# Patient Record
Sex: Male | Born: 1990
Health system: Southern US, Community
[De-identification: ages and names within clinical notes are randomized; demographics above are authoritative.]

---
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
## 2016-04-27 DIAGNOSIS — R05 Cough: Secondary | ICD-10-CM | POA: Diagnosis not present

## 2016-04-27 DIAGNOSIS — J029 Acute pharyngitis, unspecified: Secondary | ICD-10-CM | POA: Diagnosis not present

## 2016-07-31 DIAGNOSIS — J3489 Other specified disorders of nose and nasal sinuses: Secondary | ICD-10-CM | POA: Diagnosis not present

## 2016-07-31 DIAGNOSIS — R599 Enlarged lymph nodes, unspecified: Secondary | ICD-10-CM | POA: Diagnosis not present

## 2016-09-13 DIAGNOSIS — L600 Ingrowing nail: Secondary | ICD-10-CM | POA: Diagnosis not present

## 2016-09-15 DIAGNOSIS — L600 Ingrowing nail: Secondary | ICD-10-CM | POA: Diagnosis not present

## 2016-11-27 DIAGNOSIS — R112 Nausea with vomiting, unspecified: Secondary | ICD-10-CM | POA: Diagnosis not present

## 2016-11-27 DIAGNOSIS — R5383 Other fatigue: Secondary | ICD-10-CM | POA: Diagnosis not present

## 2017-06-21 DIAGNOSIS — W109XXA Fall (on) (from) unspecified stairs and steps, initial encounter: Secondary | ICD-10-CM | POA: Diagnosis not present

## 2017-06-21 DIAGNOSIS — M546 Pain in thoracic spine: Secondary | ICD-10-CM | POA: Diagnosis not present

## 2017-07-28 DIAGNOSIS — S335XXA Sprain of ligaments of lumbar spine, initial encounter: Secondary | ICD-10-CM | POA: Diagnosis not present

## 2017-07-31 DIAGNOSIS — M62830 Muscle spasm of back: Secondary | ICD-10-CM | POA: Diagnosis not present

## 2017-12-27 DIAGNOSIS — F411 Generalized anxiety disorder: Secondary | ICD-10-CM | POA: Insufficient documentation

## 2017-12-27 DIAGNOSIS — F332 Major depressive disorder, recurrent severe without psychotic features: Secondary | ICD-10-CM | POA: Insufficient documentation

## 2017-12-27 DIAGNOSIS — G8929 Other chronic pain: Secondary | ICD-10-CM | POA: Insufficient documentation

## 2018-08-13 ENCOUNTER — Ambulatory Visit (INDEPENDENT_AMBULATORY_CARE_PROVIDER_SITE_OTHER): Payer: 59 | Admitting: Licensed Clinical Social Worker

## 2018-08-13 DIAGNOSIS — F411 Generalized anxiety disorder: Secondary | ICD-10-CM | POA: Diagnosis not present

## 2018-08-15 ENCOUNTER — Encounter (HOSPITAL_COMMUNITY): Payer: Self-pay | Admitting: Licensed Clinical Social Worker

## 2018-08-15 NOTE — Progress Notes (Signed)
Virtual Visit via Video Note  I connected with Ronald Callahan on 08/14/18 at  2:30 PM EDT by a video enabled telemedicine application and verified that I am speaking with the correct person using two identifiers.  Location: Patient: Home Provider: Office   I discussed the limitations of evaluation and management by telemedicine and the availability of in person appointments. The patient expressed understanding and agreed to proceed.     I discussed the assessment and treatment plan with the patient. The patient was provided an opportunity to ask questions and all were answered. The patient agreed with the plan and demonstrated an understanding of the instructions.   The patient was advised to call back or seek an in-person evaluation if the symptoms worsen or if the condition fails to improve as anticipated.  I provided 55 minutes of non-face-to-face time during this encounter.   Archie Balboa, LCAS-A    Comprehensive Clinical Assessment (CCA) Note  08/15/2018 Ronald Callahan [id number]  Visit Diagnosis:      ICD-10-CM   1. Generalized anxiety disorder  F41.1       CCA Part One  Part One has been completed on paper by the patient.  (See scanned document in Chart Review)  CCA Part Two A  Intake/Chief Complaint:  CCA Intake With Chief Complaint CCA Part Two Date: 08/13/18 CCA Part Two Time: 35 Chief Complaint/Presenting Problem: Referred by PHP for help w/ anxiety and depression Patients Currently Reported Symptoms/Problems: excessive worry, restlessness, tension, isolating, catastrophizing Individual's Strengths: "I like to complete things" Individual's Abilities: Able bodied Type of Services Patient Feels Are Needed: Not sure  Mental Health Symptoms Depression:  Depression: Hopelessness, Difficulty Concentrating, Sleep (too much or little), Weight gain/loss, Worthlessness, Irritability, Change in energy/activity  Mania:     Anxiety:   Anxiety: Difficulty  concentrating, Tension, Worrying, Restlessness, Irritability, Fatigue  Psychosis:     Trauma:     Obsessions:     Compulsions:     Inattention:     Hyperactivity/Impulsivity:     Oppositional/Defiant Behaviors:     Borderline Personality:     Other Mood/Personality Symptoms:      Mental Status Exam Appearance and self-care  Stature:  Stature: Average  Weight:  Weight: Overweight  Clothing:  Clothing: Casual  Grooming:  Grooming: Normal  Cosmetic use:  Cosmetic Use: None  Posture/gait:  Posture/Gait: Normal  Motor activity:  Motor Activity: Not Remarkable  Sensorium  Attention:  Attention: Normal, Persistent  Concentration:  Concentration: Normal  Orientation:  Orientation: X5  Recall/memory:  Recall/Memory: Normal  Affect and Mood  Affect:  Affect: Blunted  Mood:  Mood: Euthymic  Relating  Eye contact:  Eye Contact: Normal  Facial expression:  Facial Expression: Responsive  Attitude toward examiner:  Attitude Toward Examiner: Cooperative  Thought and Language  Speech flow: Speech Flow: Normal  Thought content:  Thought Content: Appropriate to mood and circumstances  Preoccupation:     Hallucinations:     Organization:     Transport planner of Knowledge:  Fund of Knowledge: Average  Intelligence:  Intelligence: Average  Abstraction:  Abstraction: Psychologist, sport and exercise:  Judgement: Common-sensical  Reality Testing:  Reality Testing: Adequate  Insight:  Insight: Flashes of insight  Decision Making:  Decision Making: Normal  Social Functioning  Social Maturity:  Social Maturity: Responsible  Social Judgement:  Social Judgement: Normal  Stress  Stressors:  Stressors: Transitions  Coping Ability:     Skill Deficits:     Supports:  Family and Psychosocial History: Family history Marital status: Long term relationship Long term relationship, how long?: 6 years What types of issues is patient dealing with in the relationship?: PT wants to be alone when he  has anxiety, Partner tries to be supportive but PT wants to be alone. Additional relationship information: engaged 1 month ago Are you sexually active?: Yes What is your sexual orientation?: heterosexual Does patient have children?: No  Childhood History:  Childhood History By whom was/is the patient raised?: Both parents Additional childhood history information: na Description of patient's relationship with caregiver when they were a child: Good w/ mother, father was always working a lot; No one in my family really showed much emotion but when they did there was yelling. Patient's description of current relationship with people who raised him/her: My parents are in the middle of a divorce, it's sad but I've seen it coming. Does patient have siblings?: Yes Number of Siblings: 3 Description of patient's current relationship with siblings: Older sister and younger brother and sister. My older sister has anxiety. I see them all for holidays and occassionally throughout the year Did patient suffer any verbal/emotional/physical/sexual abuse as a child?: No Did patient suffer from severe childhood neglect?: No Has patient ever been sexually abused/assaulted/raped as an adolescent or adult?: No Was the patient ever a victim of a crime or a disaster?: No Witnessed domestic violence?: No Has patient been effected by domestic violence as an adult?: No  CCA Part Two B  Employment/Work Situation: Employment / Work Psychologist, occupationalituation Employment situation: Employed  Education: Field seismologistducation Name of High School: [employer] Did Garment/textile technologistYou Graduate From [employer]?: Yes Did Theme park managerYou Attend College?: Yes What Type of College Degree Do you Have?: Quarry managerBachelors Exercise and Sports Science  Religion: Religion/Spirituality Are You A Religious Person?: No  Leisure/Recreation: Leisure / Recreation Leisure and Hobbies: Landishing  Exercise/Diet: Exercise/Diet Do You Exercise?: Yes How Many Times a Week Do You  Exercise?: 1-3 times a week Have You Gained or Lost A Significant Amount of Weight in the Past Six Months?: Yes-Gained Do You Follow a Special Diet?: No Do You Have Any Trouble Sleeping?: Yes Explanation of Sleeping Difficulties: PT reports he wakes up often in the night  CCA Part Two C  Alcohol/Drug Use: Alcohol / Drug Use History of alcohol / drug use?: No history of alcohol / drug abuse                      CCA Part Three  ASAM's:  Six Dimensions of Multidimensional Assessment  Dimension 1:  Acute Intoxication and/or Withdrawal Potential:     Dimension 2:  Biomedical Conditions and Complications:     Dimension 3:  Emotional, Behavioral, or Cognitive Conditions and Complications:     Dimension 4:  Readiness to Change:     Dimension 5:  Relapse, Continued use, or Continued Problem Potential:     Dimension 6:  Recovery/Living Environment:      Substance use Disorder (SUD)    Social Function:  Social Functioning Social Maturity: Responsible Social Judgement: Normal  Stress:  Stress Stressors: Transitions Patient Takes Medications The Way The Doctor Instructed?: Yes Priority Risk: Low Acuity  Risk Assessment- Self-Harm Potential: Risk Assessment For Self-Harm Potential Thoughts of Self-Harm: No current thoughts Method: No plan  Risk Assessment -Dangerous to Others Potential:    DSM5 Diagnoses: There are no active problems to display for this patient.   Patient Centered Plan: Patient is on the following Treatment  Plan(s):  Anxiety  Recommendations for Services/Supports/Treatments: Recommendations for Services/Supports/Treatments Recommendations For Services/Supports/Treatments: Individual Therapy  Treatment Plan Summary: OP Treatment Plan Summary: I have general anxiety, social anxiety, and some depression  Referrals to Alternative Service(s): Referred to Alternative Service(s):   Place:   Date:   Time:    Referred to Alternative Service(s):   Place:    Date:   Time:    Referred to Alternative Service(s):   Place:   Date:   Time:    Referred to Alternative Service(s):   Place:   Date:   Time:     Margo CommonWesley E Swan

## 2018-08-27 ENCOUNTER — Ambulatory Visit (INDEPENDENT_AMBULATORY_CARE_PROVIDER_SITE_OTHER): Payer: 59 | Admitting: Licensed Clinical Social Worker

## 2018-08-27 ENCOUNTER — Other Ambulatory Visit: Payer: Self-pay

## 2018-08-27 DIAGNOSIS — F411 Generalized anxiety disorder: Secondary | ICD-10-CM

## 2018-08-27 NOTE — Progress Notes (Signed)
Patient called 15 min prior to session to state that he is unable to attend due to Internet restriction and does not want to meet via telephone.

## 2018-09-09 ENCOUNTER — Encounter (HOSPITAL_COMMUNITY): Payer: Self-pay | Admitting: Psychiatry

## 2018-09-09 ENCOUNTER — Ambulatory Visit (INDEPENDENT_AMBULATORY_CARE_PROVIDER_SITE_OTHER): Payer: 59 | Admitting: Psychiatry

## 2018-09-09 DIAGNOSIS — F411 Generalized anxiety disorder: Secondary | ICD-10-CM

## 2018-09-09 DIAGNOSIS — F331 Major depressive disorder, recurrent, moderate: Secondary | ICD-10-CM | POA: Diagnosis not present

## 2018-09-09 MED ORDER — VENLAFAXINE HCL 37.5 MG PO TABS: 37.5000 mg | ORAL_TABLET | Freq: Two times a day (BID) | ORAL | 1 refills | Status: DC

## 2018-09-09 NOTE — Progress Notes (Signed)
e  Psychiatric Initial Adult Assessment   Patient Identification: Ronald BlancJason M Callahan MRN:  [id number] Date of Evaluation:  09/09/2018 Referral Source: primary care Chief Complaint:   Chief Complaint    Anxiety; Establish Care     Visit Diagnosis:    ICD-10-CM   1. Generalized anxiety disorder  F41.1   2. MDD (major depressive disorder), recurrent episode, moderate (HCC)  F33.1    Virtual Visit via Video Note  I connected with Ronald BlancJason M Callahan on 09/09/18 at  2:00 PM EDT by a video enabled telemedicine application and verified that I am speaking with the correct person using two identifiers.   I discussed the limitations of evaluation and management by telemedicine and the availability of in person appointments. 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 He is engaged   Patient has been experiencing anxiety and depression he has been on Lexapro currently at a dose of 10 mg it was increased before but it caused more side effects or concerns he still having depression sadness withdrawn behavior at times decreased interest decreased energy and getting emotional sometimes with crying spells.  He feels Lexapro does help the depression and anxiety but some concern is a decreased libido and sexual side effects  Has been on BuSpar and Vistaril for anxiety that did not help he has been on Xanax sporadically and a small dose in the past that has helped with acute anxiety and panic-like symptoms  He endorses worries, excessive worries about his future about his marriage coming in about finances and that leads to further depression and sometimes mood agitation  He endorses episodes of depression in the last week or 2 including sadness withdrawn decreased energy decreased interest in things but no hopelessness or suicidal thoughts  Denies psychotic symptoms denies manic symptoms  currently or in the past  He is currently doing therapy missed last appointment  He likes to do fishing at times to distract himself but as of now his has been less as his interest and energy level is low  He is more worried about his anxiety rather than his depression he feels depression comes in with anxiety and excessive worries  Family history of anxiety  Amongst  his sister  No past trauma significant for bad memories from the past   Modifying factor: Fiance, job Aggravating factor: finances, wedding planning, planning for school  Duration : few years Severity of anxiety: 7/10 . 10 being extreme Timing : variable Drug use: denies drinking regularly Past pscyh admission: denies   Past Psychiatric History:anxiety  Previous Psychotropic Medications: Yes   Substance Abuse History in the last 12 months:  No.  Consequences of Substance Abuse: NA  Past Medical History: No past medical history on file. No past surgical history on file.  Family Psychiatric History: sister: anxiety  Family History: No family history on file.  Social History:   Social History   Socioeconomic History  . Marital status: Single    Spouse name: Not on file  . Number of children: Not on file  . Years of education: Not on file  . Highest education level: Not on file  Occupational History  . Not on file  Social Needs  . Financial resource strain: Not on file  . Food insecurity    Worry: Not on file    Inability: Not on file  . Transportation needs  Medical: Not on file    Non-medical: Not on file  Tobacco Use  . Smoking status: Not on file  Substance and Sexual Activity  . Alcohol use: Not on file  . Drug use: Not on file  . Sexual activity: Not on file  Lifestyle  . Physical activity    Days per week: Not on file    Minutes per session: Not on file  . Stress: Not on file  Relationships  . Social Musicianconnections    Talks on phone: Not on file    Gets together: Not on file     Attends religious service: Not on file    Active member of club or organization: Not on file    Attends meetings of clubs or organizations: Not on file    Relationship status: Not on file  Other Topics Concern  . Not on file  Social History Narrative  . Not on file    Additional Social History: grew up with parents. Growing was good. No trauma   Allergies:   Allergies  Allergen Reactions  . Amoxicillin Hives    Metabolic Disorder Labs: No results found for: HGBA1C, MPG No results found for: PROLACTIN No results found for: CHOL, TRIG, HDL, CHOLHDL, VLDL, LDLCALC No results found for: TSH  Therapeutic Level Labs: No results found for: LITHIUM No results found for: CBMZ No results found for: VALPROATE  Current Medications: Current Outpatient Medications  Medication Sig Dispense Refill  . venlafaxine (EFFEXOR) 37.5 MG tablet Take 1 tablet (37.5 mg total) by mouth 2 (two) times daily with a meal. 60 tablet 1   No current facility-administered medications for this visit.     Musculoskeletal: Strength & Muscle Tone: within normal limits Gait & Station: normal Patient leans: N/A  Psychiatric Specialty Exam: Review of Systems  Cardiovascular: Negative for chest pain.  Skin: Negative for rash.  Psychiatric/Behavioral: The patient is nervous/anxious.     There were no vitals taken for this visit.There is no height or weight on file to calculate BMI.  General Appearance: Casual  Eye Contact:  Fair  Speech:  Normal Rate  Volume:  Decreased  Mood:  subdued  Affect:  Congruent  Thought Process:  Goal Directed  Orientation:  Full (Time, Place, and Person)  Thought Content:  Rumination  Suicidal Thoughts:  No  Homicidal Thoughts:  No  Memory:  Immediate;   Fair Recent;   Fair  Judgement:  Fair  Insight:  Fair  Psychomotor Activity:  Normal  Concentration:  Concentration: Fair and Attention Span: Fair  Recall:  FiservFair  Fund of Knowledge:Fair  Language: Fair   Akathisia:  No  Handed:  Right  AIMS (if indicated):  not done  Assets:  Desire for Improvement Physical Health  ADL's:  Intact  Cognition: WNL  Sleep:  Fair   Screenings:   Assessment and Plan: as follows MDD moderate: stop lexapro in next 3 days with taper . Start effexor and increase to bid to 75mg  daily dose  Re eval side effects and call earlier if concerns Compliance with therapy to work on coping skills and anxiety Other choices if need and concern remain of side effects would be wellbutrin  GAD: start effexor as above, reviewed side effects  other choices if need to change would be to use cymbalta  I discussed the assessment and treatment plan with the patient. The patient was provided an opportunity to ask questions and all were answered. The patient agreed with the plan and demonstrated  an understanding of the instructions.   The patient was advised to call back or seek an in-person evaluation if the symptoms worsen or if the condition fails to improve as anticipated.  Fu 3 w or earlier if needed for any concerns Merian Capron, MD 8/24/20202:34 PM

## 2018-09-16 ENCOUNTER — Encounter (HOSPITAL_COMMUNITY): Payer: Self-pay | Admitting: Licensed Clinical Social Worker

## 2018-09-16 ENCOUNTER — Ambulatory Visit (INDEPENDENT_AMBULATORY_CARE_PROVIDER_SITE_OTHER): Payer: 59 | Admitting: Licensed Clinical Social Worker

## 2018-09-16 DIAGNOSIS — F411 Generalized anxiety disorder: Secondary | ICD-10-CM

## 2018-09-16 NOTE — Progress Notes (Signed)
Virtual Visit via Video Note  I connected with Ronald Callahan on 09/16/18 at  3:30 PM EDT by a video enabled telemedicine application and verified that I am speaking with the correct person using two identifiers.  Location: Patient: Home Provider: Office   I discussed the limitations of evaluation and management by telemedicine and the availability of in person appointments. The patient expressed understanding and agreed to proceed.    I discussed the assessment and treatment plan with the patient. The patient was provided an opportunity to ask questions and all were answered. The patient agreed with the plan and demonstrated an understanding of the instructions.   The patient was advised to call back or seek an in-person evaluation if the symptoms worsen or if the condition fails to improve as anticipated.  I provided 50 minutes of non-face-to-face time during this encounter.   Archie Balboa, LCAS-A    THERAPIST PROGRESS NOTE  Session Time: 3:30-4:30PM  Participation Level: Active  Behavioral Response: Well GroomedAlertEuthymic  Type of Therapy: Individual Therapy  Treatment Goals addressed: Anxiety  Interventions: CBT  Summary: Ronald Callahan is a 28 y.o. male who presents with hx of anxiety and panic attacks. He reports he continues to experience excessive anxiety and wants coping skills. I teach PT about "Downward Arrow" technique to challenge anxious cognitions. We practice it in session and PT agrees it is helpful. I also show PT a handout on "noticing thoughts instead of reacting to them". PT struggles w/ mindfulness and often feels overwhelmed by thoughts. I use sports metaphors to help PT identify ways of taking a step back "from the game". PT takes notes during session and appears active and engaged. He is unable to identify any "major instances when he felt he ruined something for himself".    Suicidal/Homicidal: Nowithout intent/plan  Therapist Response: I used  open questions, active listening, and taught psychoeducation around coping skills for anxiety. PT identified a core belief of "When things are good, I'm worried that I will ruin it".  Plan: Return again in 2 weeks.  Diagnosis:    ICD-10-CM   1. Generalized anxiety disorder  F41.[place] Cop[place] Lapier, LCAS-A 09/16/2018

## 2018-10-07 ENCOUNTER — Ambulatory Visit (INDEPENDENT_AMBULATORY_CARE_PROVIDER_SITE_OTHER): Payer: 59 | Admitting: Licensed Clinical Social Worker

## 2018-10-07 DIAGNOSIS — F411 Generalized anxiety disorder: Secondary | ICD-10-CM

## 2018-10-07 DIAGNOSIS — F331 Major depressive disorder, recurrent, moderate: Secondary | ICD-10-CM

## 2018-10-07 NOTE — Progress Notes (Signed)
PT called one hour before session to cancel w/o any excuse or reason. Did not ask for another appointment.

## 2018-10-08 ENCOUNTER — Encounter (HOSPITAL_COMMUNITY): Payer: Self-pay | Admitting: Psychiatry

## 2018-10-08 ENCOUNTER — Ambulatory Visit (INDEPENDENT_AMBULATORY_CARE_PROVIDER_SITE_OTHER): Payer: 59 | Admitting: Psychiatry

## 2018-10-08 DIAGNOSIS — F331 Major depressive disorder, recurrent, moderate: Secondary | ICD-10-CM

## 2018-10-08 DIAGNOSIS — F411 Generalized anxiety disorder: Secondary | ICD-10-CM

## 2018-10-08 MED ORDER — BUPROPION HCL ER (SR) 100 MG PO TB12: 100.0000 mg | ORAL_TABLET | Freq: Every day | ORAL | 0 refills | Status: DC

## 2018-10-08 MED ORDER — ESCITALOPRAM OXALATE 10 MG PO TABS: 10.0000 mg | ORAL_TABLET | Freq: Every day | ORAL | 0 refills | Status: DC

## 2018-10-08 NOTE — Progress Notes (Signed)
e  [facility] Psychiatric Follow up visit  Patient Identification: Ronald Callahan MRN:  [id number] Date of Evaluation:  10/08/2018 Referral Source: primary care Chief Complaint:   depression  Visit Diagnosis:    ICD-10-CM   1. Generalized anxiety disorder  F41.1   2. MDD (major depressive disorder), recurrent episode, moderate ([facility])  F33.1    Virtual Visit via Video Note  I connected with Ronald Callahan on 10/08/18 at  4:30 PM EDT by a video enabled telemedicine application and verified that I am speaking with the correct person using two identifiers.     I discussed the limitations of evaluation and management by telemedicine and the availability of in person appointments. 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 He is engaged   Changed from [place] to effexor didn't solve the libido concern He would consider lexapro back for depression and anxiety   Has been on BuSpar and Vistaril for anxiety that did not help he has been on Xanax sporadically and a small dose in the past that has helped with acute anxiety and panic-like symptoms   Denies psychotic symptoms denies manic symptoms currently or in the past  He is currently doing therapy  Still endorses worries  Family history of anxiety  Amongst  his sister  No past trauma significant for bad memories from the past   Modifying factor: Fiance, job Aggravating factor: finances, wedding planning, planning for school  Duration : few years Severity of anxiety: 7/10 . 10 being extreme Timing : variable Drug use: denies drinking regularly Past pscyh admission: denies   Past Psychiatric History:anxiety  Previous Psychotropic Medications: Yes   Substance Abuse History in the last 12 months:  No.  Consequences of Substance Abuse: NA  Past Medical History: History reviewed. No pertinent past medical history. History  reviewed. No pertinent surgical history.  Family Psychiatric History: sister: anxiety  Family History: History reviewed. No pertinent family history.  Social History:   Social History   Socioeconomic History  . Marital status: Single    Spouse name: Not on file  . Number of children: Not on file  . Years of education: Not on file  . Highest education level: Not on file  Occupational History  . Not on file  Social Needs  . Financial resource strain: Not on file  . Food insecurity    Worry: Not on file    Inability: Not on file  . Transportation needs    Medical: Not on file    Non-medical: Not on file  Tobacco Use  . Smoking status: Not on file  Substance and Sexual Activity  . Alcohol use: Not on file  . Drug use: Not on file  . Sexual activity: Not on file  Lifestyle  . Physical activity    Days per week: Not on file    Minutes per session: Not on file  . Stress: Not on file  Relationships  . Social Herbalist on phone: Not on file    Gets together: Not on file    Attends religious service: Not on file    Active member of club or organization: Not on file    Attends meetings of clubs or organizations: Not on file    Relationship status: Not on file  Other Topics Concern  . Not on file  Social History Narrative  . Not on  file    Additional Social History: grew up with parents. Growing was good. No trauma   Allergies:   Allergies  Allergen Reactions  . Amoxicillin Hives    Metabolic Disorder Labs: No results found for: HGBA1C, MPG No results found for: PROLACTIN No results found for: CHOL, TRIG, HDL, CHOLHDL, VLDL, LDLCALC No results found for: TSH  Therapeutic Level Labs: No results found for: LITHIUM No results found for: CBMZ No results found for: VALPROATE  Current Medications: Current Outpatient Medications  Medication Sig Dispense Refill  . buPROPion (WELLBUTRIN SR) 100 MG 12 hr tablet Take 1 tablet (100 mg total) by mouth daily.  30 tablet 0  . escitalopram (LEXAPRO) 10 MG tablet Take 1 tablet (10 mg total) by mouth daily. 30 tablet 0   No current facility-administered medications for this visit.       Psychiatric Specialty Exam: Review of Systems  Cardiovascular: Negative for chest pain.  Skin: Negative for rash.  Psychiatric/Behavioral: The patient is nervous/anxious.     There were no vitals taken for this visit.There is no height or weight on file to calculate BMI.  General Appearance: Casual  Eye Contact:  Fair  Speech:  Normal Rate  Volume:  Decreased  Mood: somewhat subdued  Affect:  Congruent  Thought Process:  Goal Directed  Orientation:  Full (Time, Place, and Person)  Thought Content:  Rumination  Suicidal Thoughts:  No  Homicidal Thoughts:  No  Memory:  Immediate;   Fair Recent;   Fair  Judgement:  Fair  Insight:  Fair  Psychomotor Activity:  Normal  Concentration:  Concentration: Fair and Attention Span: Fair  Recall:  [employer] of Knowledge:Fair  Language: Fair  Akathisia:  No  Handed:  Right  AIMS (if indicated):  not done  Assets:  Desire for Improvement Physical Health  ADL's:  Intact  Cognition: WNL  Sleep:  Fair   Screenings:   Assessment and Plan: as follows MDD moderate: somewhat subdued. Dc effexor. Re start lexapro increase to 10mg  in 3 days Add wellbutrin for concerning side effects Compliance with therapy to work on coping skills and anxiety   GAD: ongoing . Restart lexapro  other choices if need to change would be to use cymbalta  I discussed the assessment and treatment plan with the patient. The patient was provided an opportunity to ask questions and all were answered. The patient agreed with the plan and demonstrated an understanding of the instructions.   The patient was advised to call back or seek an in-person evaluation if the symptoms worsen or if the condition fails to improve as anticipated.  Fu 3 w or earlier if needed for any concerns , MD 9/22/20204:44 PM

## 2018-11-02 ENCOUNTER — Other Ambulatory Visit (HOSPITAL_COMMUNITY): Payer: Self-pay | Admitting: Psychiatry

## 2018-11-07 ENCOUNTER — Encounter (HOSPITAL_COMMUNITY): Payer: Self-pay | Admitting: Psychiatry

## 2018-11-07 ENCOUNTER — Ambulatory Visit (INDEPENDENT_AMBULATORY_CARE_PROVIDER_SITE_OTHER): Payer: 59 | Admitting: Psychiatry

## 2018-11-07 DIAGNOSIS — F331 Major depressive disorder, recurrent, moderate: Secondary | ICD-10-CM | POA: Diagnosis not present

## 2018-11-07 DIAGNOSIS — F411 Generalized anxiety disorder: Secondary | ICD-10-CM

## 2018-11-07 MED ORDER — BUPROPION HCL ER (SR) 100 MG PO TB12
100.0000 mg | ORAL_TABLET | Freq: Every day | ORAL | 0 refills | Status: DC
Start: 2018-11-07 — End: 2018-12-17

## 2018-11-07 MED ORDER — GABAPENTIN 100 MG PO CAPS: 100.0000 mg | ORAL_CAPSULE | Freq: Two times a day (BID) | ORAL | 0 refills | Status: DC

## 2018-11-07 NOTE — Progress Notes (Signed)
e  [facility] Follow up visit  Patient Identification: Ronald Callahan MRN:  [id number] Date of Evaluation:  11/07/2018 Referral Source: primary care Chief Complaint:   depression  Visit Diagnosis:    ICD-10-CM   1. MDD (major depressive disorder), recurrent episode, moderate (HCC)  F33.1   2. Generalized anxiety disorder  F41.1    Virtual Visit via Video Note  I connected with Ronald Callahan on 11/07/18 at  3:30 PM EDT by a video enabled telemedicine application and verified that I am speaking with the correct person using two identifiers.      I discussed the limitations of evaluation and management by telemedicine and the availability of in person appointments. 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 wellbutrin was started still had libido concern So he self stopped lexapro Feels depression better but worries are excessive at times Have tried buspar, vistaril before  Says in past prozac was used not sure if he had concerns Discussed gabapentin as probable choice  Denies psychotic symptoms denies manic symptoms currently or in the past  He is currently doing therapy  Still endorses worries  Family history of anxiety  Amongst  his sister  No past trauma significant for bad memories from the past   Modifying factor: Fiance, job Aggravating factor: finances, wedding planning, planning for school  Duration : few years Severity of anxiety: 7/10 . 10 being extreme Timing : variable Drug use: denies drinking regularly Past pscyh admission: denies   Past Psychiatric History:anxiety  Previous Psychotropic Medications: Yes   Substance Abuse History in the last 12 months:  No.  Consequences of Substance Abuse: NA  Past Medical History: History reviewed. No pertinent past medical  history. History reviewed. No pertinent surgical history.  Family Psychiatric History: sister: anxiety  Family History: History reviewed. No pertinent family history.  Social History:   Social History   Socioeconomic History  . Marital status: Single    Spouse name: Not on file  . Number of children: Not on file  . Years of education: Not on file  . Highest education level: Not on file  Occupational History  . Not on file  Social Needs  . Financial resource strain: Not on file  . Food insecurity    Worry: Not on file    Inability: Not on file  . Transportation needs    Medical: Not on file    Non-medical: Not on file  Tobacco Use  . Smoking status: Not on file  Substance and Sexual Activity  . Alcohol use: Not on file  . Drug use: Not on file  . Sexual activity: Not on file  Lifestyle  . Physical activity    Days per week: Not on file    Minutes per session: Not on file  . Stress: Not on file  Relationships  . Social Musician on phone: Not on file    Gets together: Not on file    Attends religious service: Not on file    Active member of club or organization: Not on file    Attends meetings of clubs or organizations: Not on file    Relationship status: Not on file  Other Topics Concern  . Not on file  Social History Narrative  . Not on file  Additional Social History: grew up with parents. Growing was good. No trauma   Allergies:   Allergies  Allergen Reactions  . Amoxicillin Hives    Metabolic Disorder Labs: No results found for: HGBA1C, MPG No results found for: PROLACTIN No results found for: CHOL, TRIG, HDL, CHOLHDL, VLDL, LDLCALC No results found for: TSH  Therapeutic Level Labs: No results found for: LITHIUM No results found for: CBMZ No results found for: VALPROATE  Current Medications: Current Outpatient Medications  Medication Sig Dispense Refill  . buPROPion (WELLBUTRIN SR) 100 MG 12 hr tablet Take 1 tablet (100 mg  total) by mouth daily. 30 tablet 0  . escitalopram (LEXAPRO) 10 MG tablet Take 1 tablet (10 mg total) by mouth daily. 30 tablet 0  . gabapentin (NEURONTIN) 100 MG capsule Take 1 capsule (100 mg total) by mouth 2 (two) times daily. 60 capsule 0   No current facility-administered medications for this visit.       Psychiatric Specialty Exam: Review of Systems  Cardiovascular: Negative for palpitations.  Skin: Negative for rash.  Psychiatric/Behavioral: The patient is nervous/anxious.     There were no vitals taken for this visit.There is no height or weight on file to calculate BMI.  General Appearance: Casual  Eye Contact:  Fair  Speech:  Normal Rate  Volume:  Decreased  Mood: fair  Affect:  Congruent  Thought Process:  Goal Directed  Orientation:  Full (Time, Place, and Person)  Thought Content:  Rumination  Suicidal Thoughts:  No  Homicidal Thoughts:  No  Memory:  Immediate;   Fair Recent;   Fair  Judgement:  Fair  Insight:  Fair  Psychomotor Activity:  Normal  Concentration:  Concentration: Fair and Attention Span: Fair  Recall:  [employer] of Knowledge:Fair  Language: Fair  Akathisia:  No  Handed:  Right  AIMS (if indicated):  not done  Assets:  Desire for Improvement Physical Health  ADL's:  Intact  Cognition: WNL  Sleep:  Fair   Screenings:   Assessment and Plan: as follows MDD moderate: fair, continue wellbutrin lexapro has been self stopped  Compliance with therapy to work on Radiographer, therapeutic and anxiety   GAD: does endorse worries gets excessive, will start gabapentin 100mg  bid   other choices if need to change would be to use cymbalta  I discussed the assessment and treatment plan with the patient. The patient was provided an opportunity to ask questions and all were answered. The patient agreed with the plan and demonstrated an understanding of the instructions.   The patient was advised to call back or seek an in-person evaluation if the symptoms  worsen or if the condition fails to improve as anticipated.  Fu 3 w or earlier if needed for any concerns Merian Capron, MD 10/22/20203:39 PM

## 2018-12-17 ENCOUNTER — Encounter (HOSPITAL_COMMUNITY): Payer: Self-pay | Admitting: Psychiatry

## 2018-12-17 ENCOUNTER — Ambulatory Visit (INDEPENDENT_AMBULATORY_CARE_PROVIDER_SITE_OTHER): Payer: 59 | Admitting: Psychiatry

## 2018-12-17 DIAGNOSIS — F331 Major depressive disorder, recurrent, moderate: Secondary | ICD-10-CM | POA: Diagnosis not present

## 2018-12-17 DIAGNOSIS — F411 Generalized anxiety disorder: Secondary | ICD-10-CM | POA: Diagnosis not present

## 2018-12-17 MED ORDER — GABAPENTIN 100 MG PO CAPS
100.0000 mg | ORAL_CAPSULE | Freq: Two times a day (BID) | ORAL | 1 refills | Status: DC
Start: 2018-12-17 — End: 2020-01-26

## 2018-12-17 MED ORDER — BUPROPION HCL ER (SR) 100 MG PO TB12: 100.0000 mg | ORAL_TABLET | Freq: Every day | ORAL | 1 refills | Status: DC

## 2018-12-17 NOTE — Progress Notes (Signed)
[facility] Follow up visit  Patient Identification: Ronald Callahan MRN:  [id number] Date of Evaluation:  12/17/2018 Referral Source: primary care Chief Complaint:   depression  Visit Diagnosis:    ICD-10-CM   1. Generalized anxiety disorder  F41.1   2. MDD (major depressive disorder), recurrent episode, moderate (HCC)  F33.1    Virtual Visit via Video Note I connected with NHAT HEARNE on 12/17/18 at  3:00 PM EST by a video enabled telemedicine application and verified that I am speaking with the correct person using two identifiers.       I discussed the limitations of evaluation and management by telemedicine and the availability of in person appointments. 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 10 being extreme Timing : variable Drug use: denies drinking regularly Past pscyh admission: denies   Past Psychiatric History:anxiety  Previous Psychotropic Medications: Yes   Substance Abuse History in the last 12 months:  No.  Consequences of Substance Abuse: NA  Past Medical History: No past medical history on file. No past surgical history on file.  Family Psychiatric History: sister: anxiety  Family History: No family history on file.  Social History:   Social History   Socioeconomic History  . Marital status: Single   Spouse name: Not on file  . Number of children: Not on file  . Years of education: Not on file  . Highest education level: Not on file  Occupational History  . Not on file  Social Needs  . Financial resource strain: Not on file  . Food insecurity    Worry: Not on file    Inability: Not on file  . Transportation needs    Medical: Not on file    Non-medical: Not on file  Tobacco Use  . Smoking status: Not on file  Substance and Sexual Activity  . Alcohol use: Not on file  . Drug use: Not on file  . Sexual activity: Not on file  Lifestyle  . Physical activity    Days per week: Not on file    Minutes per session: Not on file  . Stress: Not on file  Relationships  . Social Musician on phone: Not on file    Gets together: Not on file    Attends religious service: Not on file    Active member of club or organization: Not on file    Attends meetings of clubs or organizations: Not on file    Relationship status: Not on file  Other Topics Concern  . Not on file  Social History Narrative  . Not on file    Additional Social History: grew up with parents. Growing was good. No trauma   Allergies:   Allergies  Allergen Reactions  . Amoxicillin Hives    Metabolic Disorder Labs: No results found for: HGBA1C, MPG No  results found for: PROLACTIN No results found for: CHOL, TRIG, HDL, CHOLHDL, VLDL, LDLCALC No results found for: TSH  Therapeutic Level Labs: No results found for: LITHIUM No results found for: CBMZ No results found for: VALPROATE  Current Medications: Current Outpatient Medications  Medication Sig Dispense Refill  . buPROPion (WELLBUTRIN SR) 100 MG 12 hr tablet Take 1 tablet (100 mg total) by mouth daily. 30 tablet 1  . gabapentin (NEURONTIN) 100 MG capsule Take 1 capsule (100 mg total) by mouth 2 (two) times daily. 60 capsule 1   No current facility-administered medications for this visit.       Psychiatric Specialty Exam: Review of  Systems  Cardiovascular: Negative for palpitations.  Skin: Negative for rash.    There were no vitals taken for this visit.There is no height or weight on file to calculate BMI.  General Appearance: Casual  Eye Contact:  Fair  Speech:  Normal Rate  Volume:  Decreased  Mood fair  Affect:  Congruent  Thought Process:  Goal Directed  Orientation:  Full (Time, Place, and Person)  Thought Content:  Rumination  Suicidal Thoughts:  No  Homicidal Thoughts:  No  Memory:  Immediate;   Fair Recent;   Fair  Judgement:  Fair  Insight:  Fair  Psychomotor Activity:  Normal  Concentration:  Concentration: Fair and Attention Span: Fair  Recall:  [employer] of Knowledge:Fair  Language: Fair  Akathisia:  No  Handed:  Right  AIMS (if indicated):  not done  Assets:  Desire for Improvement Physical Health  ADL's:  Intact  Cognition: WNL  Sleep:  Fair   Screenings:   Assessment and Plan: as follows MDD moderate:fair: continue wellbutrin Compliance with therapy to work on coping skills and anxiety   GAD: better on gabapentin, will conitnue I discussed the assessment and treatment plan with the patient. The patient was provided an opportunity to ask questions and all were answered. The patient agreed with the plan and demonstrated an understanding of the instructions.   The patient was advised to call back or seek an in-person evaluation if the symptoms worsen or if the condition fails to improve as anticipated. Fu 76m  Merian Capron, MD 12/1/20203:08 PM

## 2018-12-25 ENCOUNTER — Other Ambulatory Visit: Payer: Self-pay

## 2018-12-25 DIAGNOSIS — Z20822 Contact with and (suspected) exposure to covid-19: Secondary | ICD-10-CM

## 2018-12-26 LAB — NOVEL CORONAVIRUS, NAA: SARS-CoV-2, NAA: NOT DETECTED

## 2019-03-17 ENCOUNTER — Ambulatory Visit (HOSPITAL_COMMUNITY): Payer: 59 | Admitting: Psychiatry

## 2020-01-26 ENCOUNTER — Ambulatory Visit (INDEPENDENT_AMBULATORY_CARE_PROVIDER_SITE_OTHER): Payer: Self-pay | Admitting: Adult Health

## 2020-01-26 ENCOUNTER — Encounter: Payer: Self-pay | Admitting: Adult Health

## 2020-01-26 ENCOUNTER — Other Ambulatory Visit: Payer: Self-pay

## 2020-01-26 VITALS — BP 132/81 | HR 80 | Ht 69.0 in | Wt 260.0 lb

## 2020-01-26 DIAGNOSIS — F331 Major depressive disorder, recurrent, moderate: Secondary | ICD-10-CM

## 2020-01-26 DIAGNOSIS — F411 Generalized anxiety disorder: Secondary | ICD-10-CM

## 2020-01-26 DIAGNOSIS — F909 Attention-deficit hyperactivity disorder, unspecified type: Secondary | ICD-10-CM | POA: Insufficient documentation

## 2020-01-26 MED ORDER — ALPRAZOLAM 0.25 MG PO TABS: 0.2500 mg | ORAL_TABLET | Freq: Every day | ORAL | 2 refills | Status: DC | PRN

## 2020-01-26 MED ORDER — AMPHETAMINE-DEXTROAMPHETAMINE 20 MG PO TABS: 20.0000 mg | ORAL_TABLET | Freq: Two times a day (BID) | ORAL | 0 refills | Status: DC

## 2020-01-26 NOTE — Progress Notes (Signed)
[facility] MD/PA/NP Initial Note  01/26/2020 1:46 PM Ronald Callahan  MRN:  [id number]  Chief Complaint:  Chief Complaint    Adjustment Disorder      HPI:   Describes mood today as "so-so". Pleasant. Rarely tearful. Mood symptoms - reports depression, anxiety, and irritability. More "anxious" overall. Started in high school and continued through college. Worries a lot - "mostly over stupid things". Wife pointing out his lack of attention. Forgetfulness. 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 Difficulties completing tasks around the house. Will start tasks and not be able to finish them. Did well in college [employer] - A, B, and C student. Has tried medications - Lexapro and Wellbutrin - in the past to manage symptoms, but had sexual side effects. He and wife are planning to start a family and is concerned about libido.C Stable interest and motivation. Taking medications as prescribed.  Energy levels "lacking". Feels tired throughout the day. Active, does not have a regular exercise routine - "trying to get back into things". Enjoys some usual interests and activities. Recently married - October 2021. Lives with wife and 2 dogs. Family in area. Spending time with family. Appetite adequate. Weight stable - 260 pounds. Sleeps better some nights than others.  Focus and concentration difficulties. Remembers issues in childhood. Completing tasks. Managing aspects of household. 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  Denies AH or VH.  Previous medication trials: Lexapro, Wellbutrin, Xanax, Gabapentin  Visit Diagnosis:    ICD-10-CM   1. Generalized anxiety disorder  F41.1 ALPRAZolam (XANAX) 0.25 MG tablet  2. MDD (major depressive disorder), recurrent episode, moderate (HCC)  F33.1   3. Attention deficit hyperactivity disorder (ADHD), unspecified ADHD type  F90.9  amphetamine-dextroamphetamine (ADDERALL) 20 MG tablet    Past Psychiatric History: Denies psychiatric hospitalization.  Past Medical History: No past medical history on file. No past surgical history on file.  Family Psychiatric History: Sisters with anxiety.  Family History: No family history on file.  Social History:  Social History   Socioeconomic History  . Marital status: Married    Spouse name: Not on file  . Number of children: Not on file  . Years of education: Not on file  . Highest education level: Not on file  Occupational History  . Not on file  Tobacco Use  . Smoking status: Never Smoker  . Smokeless tobacco: Never Used  Substance and Sexual Activity  . Alcohol use: Not on file  . Drug use: Not on file  . Sexual activity: Not on file  Other Topics Concern  . Not on file  Social History Narrative  . Not on file   Social Determinants of Health   Financial Resource Strain: Not on file  Food Insecurity: Not on file  Transportation Needs: Not on file  Physical Activity: Not on file  Stress: Not on file  Social Connections: Not on file    Allergies:  Allergies  Allergen Reactions  . Amoxicillin Hives    Metabolic Disorder Labs: No results found for: HGBA1C, MPG No results found for: PROLACTIN No results found for: CHOL, TRIG, HDL, CHOLHDL, VLDL, LDLCALC No results found for: TSH  Therapeutic Level Labs: No results found for: LITHIUM No results found for: VALPROATE No components found for:  CBMZ  Current Medications: Current Outpatient Medications  Medication Sig Dispense Refill  . ALPRAZolam (XANAX) 0.25 MG tablet Take 1 tablet (0.25 mg total)  by mouth daily as needed for anxiety. 30 tablet 2  . amphetamine-dextroamphetamine (ADDERALL) 20 MG tablet Take 1 tablet (20 mg total) by mouth 2 (two) times daily. 60 tablet 0  . COVID-19 Specimen Collection KIT USE 1 KIT TODAY AS DIRECTED     No current facility-administered medications for this  visit.    Medication Side Effects: none  Orders placed this visit:  No orders of the defined types were placed in this encounter.   Psychiatric Specialty Exam:  Review of Systems  Musculoskeletal: Negative for gait problem.  Neurological: Negative for tremors.  Psychiatric/Behavioral:       Please refer to HPI    Blood pressure 132/81, pulse 80, height '5\' 9"'  (1.753 m), weight 260 lb (117.9 kg).Body mass index is 38.4 kg/m.  General Appearance: Casual, Neat and Well Groomed  Eye Contact:  Good  Speech:  Clear and Coherent and Normal Rate  Volume:  Normal  Mood:  Anxious, Depressed and Irritable  Affect:  Appropriate and Congruent  Thought Process:  Coherent and Descriptions of Associations: Intact  Orientation:  Full (Time, Place, and Person)  Thought Content: Logical   Suicidal Thoughts:  No  Homicidal Thoughts:  No  Memory:  WNL  Judgement:  Good  Insight:  Good  Psychomotor Activity:  Normal  Concentration:  Concentration: Good  Recall:  Good  Fund of Knowledge: Good  Language: Good  Assets:  Communication Skills Desire for Improvement Financial Resources/Insurance Housing Intimacy Leisure Time Physical Health Resilience Social Support Talents/Skills Transportation Vocational/Educational  ADL's:  Intact  Cognition: WNL  Prognosis:  Good   Screenings: ADHD screnning  Receiving Psychotherapy: No   Treatment Plan/Recommendations:    Plan:  Administered [facility] ADHD testing - 48/58 ADHD likely.  PDMP reviewed  132/81 - 80  1. Add Xanax 0.79m daily 2. Add Adderall 273mBID  Read and reviewed note with patient for accuracy.   RTC 4 weeks  Patient advised to contact office with any questions, adverse effects, or acute worsening in signs and symptoms.  Discussed potential benefits, risks, and side effects of stimulants with patient to include increased heart rate, palpitations, insomnia, increased anxiety, increased irritability, or  decreased appetite.  Instructed patient to contact office if experiencing any significant tolerability issues.    ReAloha GellNP

## 2020-03-01 ENCOUNTER — Encounter: Payer: Self-pay | Admitting: Adult Health

## 2020-03-01 ENCOUNTER — Other Ambulatory Visit: Payer: Self-pay

## 2020-03-01 ENCOUNTER — Ambulatory Visit (INDEPENDENT_AMBULATORY_CARE_PROVIDER_SITE_OTHER): Payer: BC Managed Care – PPO | Admitting: Adult Health

## 2020-03-01 DIAGNOSIS — F331 Major depressive disorder, recurrent, moderate: Secondary | ICD-10-CM

## 2020-03-01 DIAGNOSIS — F411 Generalized anxiety disorder: Secondary | ICD-10-CM

## 2020-03-01 DIAGNOSIS — F909 Attention-deficit hyperactivity disorder, unspecified type: Secondary | ICD-10-CM | POA: Diagnosis not present

## 2020-03-01 DIAGNOSIS — F410 Panic disorder [episodic paroxysmal anxiety] without agoraphobia: Secondary | ICD-10-CM

## 2020-03-01 MED ORDER — ALPRAZOLAM 0.5 MG PO TABS
ORAL_TABLET | ORAL | 2 refills | Status: DC
Start: 2020-03-01 — End: 2020-05-31

## 2020-03-01 MED ORDER — AMPHETAMINE-DEXTROAMPHETAMINE 20 MG PO TABS: 20.0000 mg | ORAL_TABLET | Freq: Two times a day (BID) | ORAL | 0 refills | Status: DC

## 2020-03-01 NOTE — Progress Notes (Signed)
Ronald Callahan [id number] 10-14-1990 30 y.o.  Subjective:   Patient ID:  Ronald Callahan is a 30 y.o. (DOB [birth year]) male.  Chief Complaint: No chief complaint on file.   HPI Ronald Callahan presents to the office today for follow-up of MDD, GAD, and ADHD.  Describes mood today as "ok". Pleasant. Rarely tearful. Mood symptoms - reports depression - "some down days", anxiety - "increased a little", and irritability "at times". Reports panic attacks. Continues to have periods of worrying. Can get overwhelmed at times. Enrolled in accelerated nursing program. Feels like the Adderall has helped with focus and concentration. Using Xanax for panic attacks and higher anxiety. Stable interest and motivation. Taking medications as prescribed.  Energy levels improved. Active, does not have a regular exercise routine.  Enjoys some usual interests and activities. Married. Lives with wife and 2 dogs. Family in area. Spending time with family. Appetite adequate. Weight stable - 256 pounds. Sleeps better some nights than others. Averages 6 to 8 hours.  Focus and concentration improved. Completing tasks. Managing aspects of household. Enrolled at [facility] accerated nursing program Denies SI or HI.  Denies AH or VH.  Previous medication trials: Lexapro, Wellbutrin, Xanax, Gabapentin   Review of Systems:  Review of Systems  Musculoskeletal: Negative for gait problem.  Neurological: Negative for tremors.  Psychiatric/Behavioral:       Please refer to HPI    Medications: I have reviewed the patient's current medications.  Current Outpatient Medications  Medication Sig Dispense Refill  . ALPRAZolam (XANAX) 0.5 MG tablet Take one tablet twice daily. 60 tablet 2  . amphetamine-dextroamphetamine (ADDERALL) 20 MG tablet Take 1 tablet (20 mg total) by mouth 2 (two) times daily. 60 tablet 0  . COVID-19 Specimen Collection KIT USE 1 KIT TODAY AS DIRECTED     No current facility-administered medications  for this visit.    Medication Side Effects: None  Allergies:  Allergies  Allergen Reactions  . Amoxicillin Hives    No past medical history on file.  No family history on file.  Social History   Socioeconomic History  . Marital status: Married    Spouse name: Not on file  . Number of children: Not on file  . Years of education: Not on file  . Highest education level: Not on file  Occupational History  . Not on file  Tobacco Use  . Smoking status: Never Smoker  . Smokeless tobacco: Never Used  Substance and Sexual Activity  . Alcohol use: Not on file  . Drug use: Not on file  . Sexual activity: Not on file  Other Topics Concern  . Not on file  Social History Narrative  . Not on file   Social Determinants of Health   Financial Resource Strain: Not on file  Food Insecurity: Not on file  Transportation Needs: Not on file  Physical Activity: Not on file  Stress: Not on file  Social Connections: Not on file  Intimate Partner Violence: Not on file    Past Medical History, Surgical history, Social history, and Family history were reviewed and updated as appropriate.   Please see review of systems for further details on the patient's review from today.   Objective:   Physical Exam:  There were no vitals taken for this visit.  Physical Exam Constitutional:      General: He is not in acute distress. Musculoskeletal:        General: No deformity.  Neurological:     Mental Status:  He is alert and oriented to person, place, and time.     Coordination: Coordination normal.  Psychiatric:        Attention and Perception: Attention and perception normal. He does not perceive auditory or visual hallucinations.        Mood and Affect: Mood normal. Mood is not anxious or depressed. Affect is not labile, blunt, angry or inappropriate.        Speech: Speech normal.        Behavior: Behavior normal.        Thought Content: Thought content normal. Thought content is not  paranoid or delusional. Thought content does not include homicidal or suicidal ideation. Thought content does not include homicidal or suicidal plan.        Cognition and Memory: Cognition and memory normal.        Judgment: Judgment normal.     Comments: Insight intact     Lab Review:     Component Value Date/Time   NA 139 08/07/2006 0823   K 3.8 08/07/2006 0823   CL 106 08/07/2006 0823   CO2 27 08/07/2006 0823   GLUCOSE 113 (H) 08/07/2006 0823   BUN 7 08/07/2006 0823   CREATININE 0.74 08/07/2006 0823   CALCIUM 9.5 08/07/2006 0823   GFRNONAA NOT CALCULATED 08/07/2006 0823   GFRAA  08/07/2006 0823    NOT CALCULATED        The eGFR has been calculated using the MDRD equation. This calculation has not been validated in all clinical       Component Value Date/Time   HGB 16.0 (H) 08/04/2006 2330   HCT 47.0 (H) 08/04/2006 2330    No results found for: POCLITH, LITHIUM   No results found for: PHENYTOIN, PHENOBARB, VALPROATE, CBMZ   .res Assessment: Plan:    Plan:  Administered [facility] ADHD testing - 48/58 ADHD likely.  PDMP reviewed  117*81 - 68  1. Xanax 0.28m to 0.282mBID 2. Adderall 2080mID - taking once daily  Read and reviewed note with patient for accuracy.   RTC 4 weeks  Patient advised to contact office with any questions, adverse effects, or acute worsening in signs and symptoms.  Discussed potential benefits, risks, and side effects of stimulants with patient to include increased heart rate, palpitations, insomnia, increased anxiety, increased irritability, or decreased appetite.  Instructed patient to contact office if experiencing any significant tolerability issues.    Diagnoses and all orders for this visit:  Attention deficit hyperactivity disorder (ADHD), unspecified ADHD type -     amphetamine-dextroamphetamine (ADDERALL) 20 MG tablet; Take 1 tablet (20 mg total) by mouth 2 (two) times daily.  MDD (major depressive disorder),  recurrent episode, moderate (HCC)  Generalized anxiety disorder -     ALPRAZolam (XANAX) 0.5 MG tablet; Take one tablet twice daily.  Panic attacks     Please see After Visit Summary for patient specific instructions.  Future Appointments  Date Time Provider DepEstelline/16/2022  8:00 AM Rashelle Ireland, RegBerdie OgrenP CP-CP None    No orders of the defined types were placed in this encounter.   -------------------------------

## 2020-05-31 ENCOUNTER — Other Ambulatory Visit: Payer: Self-pay

## 2020-05-31 ENCOUNTER — Encounter: Payer: Self-pay | Admitting: Adult Health

## 2020-05-31 ENCOUNTER — Ambulatory Visit (INDEPENDENT_AMBULATORY_CARE_PROVIDER_SITE_OTHER): Payer: BC Managed Care – PPO | Admitting: Adult Health

## 2020-05-31 DIAGNOSIS — F331 Major depressive disorder, recurrent, moderate: Secondary | ICD-10-CM | POA: Diagnosis not present

## 2020-05-31 DIAGNOSIS — F909 Attention-deficit hyperactivity disorder, unspecified type: Secondary | ICD-10-CM | POA: Diagnosis not present

## 2020-05-31 DIAGNOSIS — F411 Generalized anxiety disorder: Secondary | ICD-10-CM | POA: Diagnosis not present

## 2020-05-31 MED ORDER — AMPHETAMINE-DEXTROAMPHETAMINE 20 MG PO TABS: 20.0000 mg | ORAL_TABLET | Freq: Two times a day (BID) | ORAL | 0 refills | Status: AC

## 2020-05-31 MED ORDER — ALPRAZOLAM 0.5 MG PO TABS: ORAL_TABLET | ORAL | 2 refills | Status: AC

## 2020-05-31 MED ORDER — BUPROPION HCL ER (XL) 150 MG PO TB24
ORAL_TABLET | ORAL | 5 refills | Status: AC
Start: 2020-05-31 — End: ?

## 2020-05-31 NOTE — Progress Notes (Signed)
Ronald Callahan [id number] 1990-07-13 30 y.o.  Subjective:   Patient ID:  Ronald Callahan is a 30 y.o. (DOB 02-26-1990) male.  Chief Complaint: No chief complaint on file.   HPI Ronald Callahan presents to the office today for follow-up of MDD, GAD, panic attacks and ADHD.  Describes mood today as "ok". Pleasant. Rarely tearful. Mood symptoms - reports depression - "lots of own days lately", anxiety - "not to bad - using Xanax occasionally", and irritability "sometimes". Reports panic attacks - at a minimum. Continues to have periods of worrying - "part of what I'm doing". Enrolled in accelerated nursing program. Feels like the Adderall has helped with focus and concentration. Stable interest and motivation. Taking medications as prescribed.  Energy levels down some with the diet. Active, does not have a regular exercise routine.  Enjoys some usual interests and activities. Married. Lives with wife and 2 dogs. Family in area. Spending time with family. Appetite adequate. Weight loss - intentional - 256 to 220 pounds - Optivia diet. Sleeps better some nights than others. Averages 6 to 8 hours.  Focus and concentration improved. Completing tasks. Managing aspects of household. Enrolled at [facility] accerated nursing program. Denies SI or HI.  Denies AH or VH.  Previous medication trials: Lexapro, Wellbutrin, Xanax, Gabapentin  Review of Systems:  Review of Systems  Musculoskeletal: Negative for gait problem.  Neurological: Negative for tremors.  Psychiatric/Behavioral:       Please refer to HPI    Medications: I have reviewed the patient's current medications.  Current Outpatient Medications  Medication Sig Dispense Refill  . buPROPion (WELLBUTRIN XL) 150 MG 24 hr tablet Take one tablet daily for 7 days, then take two tablets every morning. 60 tablet 5  . ALPRAZolam (XANAX) 0.5 MG tablet Take one tablet twice daily. 60 tablet 2  . amphetamine-dextroamphetamine (ADDERALL) 20 MG tablet  Take 1 tablet (20 mg total) by mouth 2 (two) times daily. 60 tablet 0  . COVID-19 Specimen Collection KIT USE 1 KIT TODAY AS DIRECTED     No current facility-administered medications for this visit.    Medication Side Effects: None  Allergies:  Allergies  Allergen Reactions  . Amoxicillin Hives    No past medical history on file.  Past Medical History, Surgical history, Social history, and Family history were reviewed and updated as appropriate.   Please see review of systems for further details on the patient's review from today.   Objective:   Physical Exam:  There were no vitals taken for this visit.  Physical Exam Constitutional:      General: He is not in acute distress. Musculoskeletal:        General: No deformity.  Neurological:     Mental Status: He is alert and oriented to person, place, and time.     Coordination: Coordination normal.  Psychiatric:        Attention and Perception: Attention and perception normal. He does not perceive auditory or visual hallucinations.        Mood and Affect: Mood normal. Mood is not anxious or depressed. Affect is not labile, blunt, angry or inappropriate.        Speech: Speech normal.        Behavior: Behavior normal.        Thought Content: Thought content normal. Thought content is not paranoid or delusional. Thought content does not include homicidal or suicidal ideation. Thought content does not include homicidal or suicidal plan.  Cognition and Memory: Cognition and memory normal.        Judgment: Judgment normal.     Comments: Insight intact     Lab Review:     Component Value Date/Time   NA 139 08/07/2006 0823   K 3.8 08/07/2006 0823   CL 106 08/07/2006 0823   CO2 27 08/07/2006 0823   GLUCOSE 113 (H) 08/07/2006 0823   BUN 7 08/07/2006 0823   CREATININE 0.74 08/07/2006 0823   CALCIUM 9.5 08/07/2006 0823   GFRNONAA NOT CALCULATED 08/07/2006 0823   GFRAA  08/07/2006 0823    NOT CALCULATED        The  eGFR has been calculated using the MDRD equation. This calculation has not been validated in all clinical       Component Value Date/Time   HGB 16.0 (H) 08/04/2006 2330   HCT 47.0 (H) 08/04/2006 2330    No results found for: POCLITH, LITHIUM   No results found for: PHENYTOIN, PHENOBARB, VALPROATE, CBMZ   .res Assessment: Plan:    Plan:  Administered [facility] ADHD testing - 28/58 ADHD likely.  PDMP reviewed  107/68 - 62  1. Xanax 0.85m to 0.56mBID 2. Adderall 2061mID - taking once daily 3. Add Wellbutrin XL 150m21mily x 7 days, then increase to 300mg33mly. Denies seizure history.  Read and reviewed note with patient for accuracy.   RTC 3 months  Patient advised to contact office with any questions, adverse effects, or acute worsening in signs and symptoms.  Discussed potential benefits, risks, and side effects of stimulants with patient to include increased heart rate, palpitations, insomnia, increased anxiety, increased irritability, or decreased appetite.  Instructed patient to contact office if experiencing any significant tolerability issues.   Diagnoses and all orders for this visit:  Major depressive disorder, recurrent episode, moderate (HCC) -     buPROPion (WELLBUTRIN XL) 150 MG 24 hr tablet; Take one tablet daily for 7 days, then take two tablets every morning.  Attention deficit hyperactivity disorder (ADHD), unspecified ADHD type -     amphetamine-dextroamphetamine (ADDERALL) 20 MG tablet; Take 1 tablet (20 mg total) by mouth 2 (two) times daily.  Generalized anxiety disorder -     ALPRAZolam (XANAX) 0.5 MG tablet; Take one tablet twice daily.  MDD (major depressive disorder), recurrent episode, moderate (HCC) [place] Please see After Visit Summary for patient specific instructions.  Future Appointments  Date Time Provider DeparBrewster5/2022  8:00 AM Landis Dowdy, ReginBerdie OgrenCP-CP None    No orders of the defined types were  placed in this encounter.   -------------------------------

## 2020-08-30 ENCOUNTER — Ambulatory Visit: Payer: BC Managed Care – PPO | Admitting: Adult Health
